# Patient Record
Sex: Male | Born: 1970 | Marital: Single | Smoking: Former smoker
Health system: Southern US, Community
[De-identification: ages and names within clinical notes are randomized; demographics above are authoritative.]

## PROBLEM LIST (undated history)

## (undated) ENCOUNTER — Emergency Department: Discharge status: Absent without leave | Payer: Self-pay

## (undated) DIAGNOSIS — K130 Diseases of lips: Secondary | ICD-10-CM

## (undated) HISTORY — PX: HYDROCELE EXCISION / REPAIR: SUR1145

## (undated) HISTORY — DX: Diseases of lips: K13.0

---

## 2013-03-01 ENCOUNTER — Encounter: Payer: Self-pay | Admitting: Family Medicine

## 2013-03-01 ENCOUNTER — Ambulatory Visit (INDEPENDENT_AMBULATORY_CARE_PROVIDER_SITE_OTHER): Payer: BC Managed Care – PPO | Admitting: Family Medicine

## 2013-03-01 VITALS — BP 113/71 | HR 74 | Temp 99.6°F | Resp 16 | Ht 65.75 in | Wt 125.0 lb

## 2013-03-01 DIAGNOSIS — Z131 Encounter for screening for diabetes mellitus: Secondary | ICD-10-CM

## 2013-03-01 DIAGNOSIS — Z0000 Encounter for general adult medical examination without abnormal findings: Secondary | ICD-10-CM

## 2013-03-01 DIAGNOSIS — K130 Diseases of lips: Secondary | ICD-10-CM

## 2013-03-01 DIAGNOSIS — F17200 Nicotine dependence, unspecified, uncomplicated: Secondary | ICD-10-CM

## 2013-03-01 DIAGNOSIS — E789 Disorder of lipoprotein metabolism, unspecified: Secondary | ICD-10-CM

## 2013-03-01 DIAGNOSIS — M5416 Radiculopathy, lumbar region: Secondary | ICD-10-CM | POA: Insufficient documentation

## 2013-03-01 DIAGNOSIS — M545 Low back pain: Secondary | ICD-10-CM

## 2013-03-01 HISTORY — DX: Diseases of lips: K13.0

## 2013-03-01 MED ORDER — TETANUS-DIPHTH-ACELL PERTUSSIS 5-2.5-18.5 LF-MCG/0.5 IM SUSP
0.5000 mL | Freq: Once | INTRAMUSCULAR | Status: AC
  Administered 2013-03-01: 0.5 mL via INTRAMUSCULAR

## 2013-03-01 NOTE — Progress Notes (Signed)
CC: Damon Maddox is a 42 y.o. male is here for Annual Exam   Subjective: HPI:  Colonoscopy: no family hx no personal hx of rectal bleeding will begin screening at age 72 Prostate: Discussed screening risks/beneifts with patient on 03/01/2013 will begin screening at age 42  Influenza Vaccine: out of season Pneumovax: Patient is a smoker but refuses Td/Tdap: will receive today Zoster: start age 52  Pleasant 42 year old, Patient presents for complete physical exam.    He complains of a swelling on the left side of his face that has come and gone for the last decade. It will become swollen to the size of pea, he will squeeze it discharge will occur it will resolve within a day or 2.  He complains of a blue spot on his lower lip that has been present for 5-10 years it came on abruptly years after a laceration of his lip from a soccer injury. It will bleed once a month when his lips are chapped, he denies scaling skin hypertrophy nor enlargement of the lesion since onset he can confidently say it has not changed appearance in over 5 years.  He reports decades of now and then chronic back pain ever since a snowboarding accident.  Over the past 2 weeks have you been bothered by: - Little interest or pleasure in doing things: no - Feeling down depressed or hopeless:no  He's quite successful with watching what he eats, remote alcohol abuse but no risky drinking recently, one to 2 cigarettes a day no recreational drug use. Tries to stay active with cardio  Review of Systems - General ROS: negative for - chills, fever, night sweats, weight gain or weight loss Ophthalmic ROS: negative for - decreased vision Psychological ROS: negative for - anxiety or depression ENT ROS: negative for - hearing change, nasal congestion, tinnitus or allergies Hematological and Lymphatic ROS: negative for - bleeding problems, bruising or swollen lymph nodes Breast ROS: negative Respiratory ROS: no cough, shortness of  breath, or wheezing Cardiovascular ROS: no chest pain or dyspnea on exertion Gastrointestinal ROS: no abdominal pain, change in bowel habits, or black or bloody stools Genito-Urinary ROS: negative for - genital discharge, genital ulcers, incontinence or abnormal bleeding from genitals Musculoskeletal ROS: negative for - joint pain or muscle pain Neurological ROS: negative for - headaches or memory loss Dermatological ROS: negative for lumps, mole changes, rash and skin lesion changes other than that in HPI  History reviewed. No pertinent past medical history.   Family History  Problem Relation Age of Onset  . Diabetes Mother   . Cancer Father     kidney  . Heart attack Father      History  Substance Use Topics  . Smoking status: Current Some Day Smoker    Types: Cigarettes  . Smokeless tobacco: Not on file  . Alcohol Use: 2.5 oz/week    5 drink(s) per week     Objective: Filed Vitals:   03/01/13 1544  BP: 113/71  Pulse: 74  Temp: 99.6 F (37.6 C)  Resp: 16   General: No Acute Distress HEENT: Atraumatic, normocephalic, conjunctivae normal without scleral icterus.  No nasal discharge, hearing grossly intact, TMs with good landmarks bilaterally with no middle ear abnormalities, posterior pharynx clear without oral lesions. 2 mm linear vascular appearing lesion on the middle right lower  Lip. On left mandible there is a slightly tender 5 mm mobile subcutaneous mass with overlying pore no overlying erythema  Neck: Supple, trachea midline, no cervical nor  supraclavicular adenopathy. Pulmonary: Clear to auscultation bilaterally without wheezing, rhonchi, nor rales. Cardiac: Regular rate and rhythm.  No murmurs, rubs, nor gallops. No peripheral edema.  2+ peripheral pulses bilaterally. Abdomen: Bowel sounds normal.  No masses.  Non-tender without rebound.  Negative Murphy's sign. ZO:[id number] descended non-tender testicles without inguinal hernia MSK: Grossly intact, no signs of  weakness.  Full strength throughout upper and lower extremities.  Full ROM in upper and lower extremities.  No midline spinal tenderness. questionable leg length discrepancy  Neuro: Gait unremarkable, CN II-XII grossly intact.  C5-C6 Reflex 2/4 Bilaterally, L4 Reflex 2/4 Bilaterally.  Cerebellar function intact. Skin: No rashes. Psych: Alert and oriented to person/place/time.  Thought process normal. No anxiety/depression.   Assessment & Plan: Damon Maddox was seen today for annual exam.  Diagnoses and associated orders for this visit:  Annual physical exam - TDaP (BOOSTRIX) injection 0.5 mL; Inject 0.5 mLs into the muscle once.  Diabetes mellitus screening - COMPLETE METABOLIC PANEL WITH GFR  Lipid disorder - Lipid panel  Tobacco use disorder  Low back pain  Lip lesion    Healthy lifestyle interventions including but limited to regular exercise, a healthy low fat diet, moderation of salt intake, the dangers of tobacco/alcohol/recreational drug use, nutrition supplementation, and accident avoidance were discussed with the patient and a handout was provided for future reference. Discussed self testicular exam/ discussed benefits of tobacco cessation. We'll screen for type 2 diabetes, kidney and liver abnormalities along with dyslipidemia. Discussed that the lesion on his lower lip has a small chance of being squamas  cell carcinoma or a precursor however given no evolution in size or presentation in 5 years my suspicion is somewhat lowered. I like him to pay close attention to this over the next months and if any change in character we'll refer to dermatology for biopsy For his chronic back pain there may be a leg length discrepancy he is interested in orthotics as he would like to avoid medications if at all possible, referral to Dr. Karie Schwalbe. sports medicine has been placed  Return in about 4 weeks (around 03/29/2013) for Sports Med Referral.            x

## 2013-03-01 NOTE — Patient Instructions (Signed)
Dr. Roosevelt Eimers's General Advice Following Your Complete Physical Exam  The Benefits of Regular Exercise: Unless you suffer from an uncontrolled cardiovascular condition, studies strongly suggest that regular exercise and physical activity will add to both the quality and length of your life.  The World Health Organization recommends 150 minutes of moderate intensity aerobic activity every week.  This is best split over 3-4 days a week, and can be as simple as a brisk walk for just over 35 minutes "most days of the week".  This type of exercise has been shown to lower LDL-Cholesterol, lower average blood sugars, lower blood pressure, lower cardiovascular disease risk, improve memory, and increase one's overall sense of wellbeing.  The addition of anaerobic (or "strength training") exercises offers additional benefits including but not limited to increased metabolism, prevention of osteoporosis, and improved overall cholesterol levels.  How Can I Strive For A Low-Fat Diet?: Current guidelines recommend that 25-35 percent of your daily energy (food) intake should come from fats.  One might ask how can this be achieved without having to dissect each meal on a daily basis?  Switch to skim or 1% milk instead of whole milk.  Focus on lean meats such as ground turkey, fresh fish, baked chicken, and lean cuts of beef as your source of dietary protein.  Consume less than 300mg/day of dietary cholesterol.  Limit trans fatty acid consumption primarily by limiting synthetic trans fats such as partially hydrogenated oils (Ex: fried fast foods).  Focus efforts on reducing your intake of "solid" fats (Ex: Butter).  Substitute olive or vegetable oil for solid fats where possible.  Moderation of Salt Intake: Provided you don't carry a diagnosis of congestive heart failure nor renal failure, I recommend a daily allowance of no more than 2300 mg of salt (sodium).  Keeping under this daily goal is associated with a  decreased risk of cardiovascular events, creeping above it can lead to elevated blood pressures and increases your risk of cardiovascular events.  Milligrams (mg) of salt is listed on all nutrition labels, and your daily intake can add up faster than you think.  Most canned and frozen dinners can pack in over half your daily salt allowance in one meal.    Lifestyle Health Risks: Certain lifestyle choices carry specific health risks.  As you may already know, tobacco use has been associated with increasing one's risk of cardiovascular disease, pulmonary disease, numerous cancers, among many other issues.  What you may not know is that there are medications and nicotine replacement strategies that can more than double your chances of successfully quitting.  I would be thrilled to help manage your quitting strategy if you currently use tobacco products.  When it comes to alcohol use, I've yet to find an "ideal" daily allowance.  Provided an individual does not have a medical condition that is exacerbated by alcohol consumption, general guidelines determine "safe drinking" as no more than two standard drinks for a man or no more than one standard drink for a male per day.  However, much debate still exists on whether any amount of alcohol consumption is technically "safe".  My general advice, keep alcohol consumption to a minimum for general health promotion.  If you or others believe that alcohol, tobacco, or recreational drug use is interfering with your life, I would be happy to provide confidential counseling regarding treatment options.  General "Over The Counter" Nutrition Advice: Postmenopausal women should aim for a daily calcium intake of 1200 mg, however a significant   portion of this might already be provided by diets including milk, yogurt, cheese, and other dairy products.  Vitamin D has been shown to help preserve bone density, prevent fatigue, and has even been shown to help reduce falls in the  elderly.  Ensuring a daily intake of 800 Units of Vitamin D is a good place to start to enjoy the above benefits, we can easily check your Vitamin D level to see if you'd potentially benefit from supplementation beyond 800 Units a day.  Folic Acid intake should be of particular concern to women of childbearing age.  Daily consumption of 400-800 mcg of Folic Acid is recommended to minimize the chance of spinal cord defects in a fetus should pregnancy occur.    For many adults, accidents still remain one of the most common culprits when it comes to cause of death.  Some of the simplest but most effective preventitive habits you can adopt include regular seatbelt use, proper helmet use, securing firearms, and regularly testing your smoke and carbon monoxide detectors.  Brandey Vandalen B. [facility] DO Med [facility] [address] [place], [place] [address] Phone: [phone]   Testicular Problems and Self-Exam Men can examine themselves easily and effectively with positive results. Monthly exams detect problems early and save lives. There are numerous causes of swelling in the testicle. Testicular cancer usually appears as a firm painless lump in the front part of the testicle. This may feel like a dull ache or heavy feeling located in the lower abdomen (belly), groin, or scrotum.  The risk is greater in men with undescended testicles and it is more common in young men. It is responsible for almost a fifth of cancers in males between ages 100 and 65. Other common causes of swellings, lumps, and testicular pain include injuries, inflammation (soreness) from infection, hydrocele, and torsion. These are a few of the reasons to do monthly self-examination of the testicles. The exam only takes minutes and could add years to your life. Get in the habit! SELF-EXAMINATION OF THE TESTICLES The testicles are easiest to examine after warm baths or showers and are more difficult to examine when you are cold.  This is because the muscles attached to the testicles retract and pull them up higher or into the abdomen. While standing, roll one testicle between the thumb and forefinger. Feel for lumps, swelling, or discomfort. A normal testicle is egg shaped and feels firm. It is smooth and not tender. The spermatic cord can be felt as a firm spaghetti-like cord at the back of the testicle. It is also important to examine your groins. This is the crease between the front of your leg and your abdomen. Also, feel for enlarged lymph nodes (glands). Enlarged nodes are also a cause for you to see your caregiver for evaluation.  Self-examination of the testicles and groin areas on a regular basis will help you to know what your own testicles and groins feel like. This will help you pick up an abnormality (difference) at an earlier stage. Early discovery is the key to curing this cancer or treating other conditions. Any lump, change, or swelling in the testicle calls for immediate evaluation by your caregiver. Cancer of the testicle does not result in impotence and it does not prevent normal intercourse or prevent having children. If your caregiver feels that medical treatment or chemotherapy could lead to infertility, sperm can be frozen for future use. It is necessary to see a caregiver as soon as possible after  the discovery of a lump in a testicle. Document Released: 12/01/2000 Document Revised: 11/17/2011 Document Reviewed: 08/26/2008 [facility] Patient Information 2014 [place], [place].

## 2013-03-02 LAB — COMPLETE METABOLIC PANEL WITH GFR
Albumin: 4.4 g/dL (ref 3.5–5.2)
BUN: 12 mg/dL (ref 6–23)
CO2: 26 mEq/L (ref 19–32)
Calcium: 9.4 mg/dL (ref 8.4–10.5)
Chloride: 104 mEq/L (ref 96–112)
GFR, Est African American: >89 mL/min
GFR, Est Non African American: >89 mL/min
Glucose, Bld: 96 mg/dL (ref 70–99)
Potassium: 4.4 mEq/L (ref 3.5–5.3)

## 2013-03-02 LAB — LIPID PANEL: Cholesterol: 159 mg/dL (ref 0–200)

## 2013-03-08 ENCOUNTER — Ambulatory Visit: Payer: BC Managed Care – PPO | Admitting: Sports Medicine

## 2013-03-09 ENCOUNTER — Ambulatory Visit: Payer: BC Managed Care – PPO | Admitting: Sports Medicine

## 2013-03-18 ENCOUNTER — Ambulatory Visit (INDEPENDENT_AMBULATORY_CARE_PROVIDER_SITE_OTHER): Payer: BC Managed Care – PPO | Admitting: Family Medicine

## 2013-03-18 ENCOUNTER — Encounter: Payer: Self-pay | Admitting: Family Medicine

## 2013-03-18 VITALS — BP 114/78 | HR 73 | Wt 127.0 lb

## 2013-03-18 DIAGNOSIS — L723 Sebaceous cyst: Secondary | ICD-10-CM

## 2013-03-18 NOTE — Progress Notes (Signed)
CC: Damon Maddox is a 42 y.o. male is here for Cyst   Subjective: HPI:  Patient complains of left ear swelling that has been present for 2 weeks it came on abruptly described as mild in severity localized on the back of the left ear just medial to the earlobe. It is slightly tender to the touch it seems to enlarge the more he touches it, it has significantly shrunk over the past 2 days since he has stopped touching it. He denies drainage or skin changes overlying this lesion. He has never noticed this before. He denies hearing loss, ear pain, ear drainage, scalp pain, skin changes, fevers, chills, unintentional weight loss, nor swollen lymph nodes nor bruisability   Review Of Systems Outlined In HPI  Past Medical History  Diagnosis Date  . Lip lesion 03/01/2013     Family History  Problem Relation Age of Onset  . Diabetes Mother   . Cancer Father     kidney  . Heart attack Father      History  Substance Use Topics  . Smoking status: Current Some Day Smoker    Types: Cigarettes  . Smokeless tobacco: Not on file  . Alcohol Use: 2.5 oz/week    5 drink(s) per week     Objective: Filed Vitals:   03/18/13 1547  BP: 114/78  Pulse: 73    General: Alert and Oriented, No Acute Distress HEENT: Pupils equal, round, reactive to light. Conjunctivae clear.  External ears unremarkable, canals clear with intact TMs with appropriate landmarks.  Middle ear appears open without effusion. Pink inferior turbinates.  Moist mucous membranes, pharynx without inflammation nor lesions.  Neck supple without palpable lymphadenopathy nor abnormal masses. Just medial to the left earlobe there is a slightly tender mobile .25 centimeter nodule without overlying skin changes. Scalp is unremarkable  Mental Status: No depression, anxiety, nor agitation. Skin: Warm and dry.  Assessment & Plan: Marico was seen today for cyst.  Diagnoses and associated orders for this visit:  Sebaceous cyst    I suspect  this is a sebaceous cyst that was inflamed and worsened with manipulation, I've encouraged him to avoid further manipulation which should speed up self resolution. Discussed I would like to avoid excision or injection of steroid if at all possible to avoid disfiguring of the ear cartilage  Return if symptoms worsen or fail to improve.

## 2013-03-24 ENCOUNTER — Ambulatory Visit: Payer: BC Managed Care – PPO | Admitting: Sports Medicine

## 2013-05-10 ENCOUNTER — Emergency Department
Admission: EM | Admit: 2013-05-10 | Discharge: 2013-05-10 | Disposition: A | Discharge status: Home / Self Care | Payer: BC Managed Care – PPO | Source: Home / Self Care | Attending: Family Medicine | Admitting: Family Medicine

## 2013-05-10 ENCOUNTER — Encounter: Payer: Self-pay | Admitting: *Deleted

## 2013-05-10 DIAGNOSIS — H01006 Unspecified blepharitis left eye, unspecified eyelid: Secondary | ICD-10-CM

## 2013-05-10 DIAGNOSIS — H01009 Unspecified blepharitis unspecified eye, unspecified eyelid: Secondary | ICD-10-CM

## 2013-05-10 DIAGNOSIS — Z716 Tobacco abuse counseling: Secondary | ICD-10-CM

## 2013-05-10 MED ORDER — AZITHROMYCIN 1 % OP SOLN: 1.0000 [drp] | Freq: Two times a day (BID) | OPHTHALMIC | Status: DC

## 2013-05-10 MED ORDER — POLYMYXIN B-TRIMETHOPRIM 10000-0.1 UNIT/ML-% OP SOLN: 1.0000 [drp] | OPHTHALMIC | Status: DC

## 2013-05-10 NOTE — ED Provider Notes (Signed)
CSN: [id number]     Arrival date & time 05/10/13  1027 History   First MD Initiated Contact with Patient 05/10/13 1039     Chief Complaint  Patient presents with  . Eye Problem    HPI  L lower eyelid irritation x 1 week  Has noticed mild irrititation.  Noticed some swelling earlier this morning.  Mild TTP  Minimal redness No eye pain  No LOV. No headache, fever, chills.  Non diabetic.   Past Medical History  Diagnosis Date  . Lip lesion 03/01/2013   Past Surgical History  Procedure Laterality Date  . Hydrocele excision / repair     Family History  Problem Relation Age of Onset  . Diabetes Mother   . Cancer Father     kidney  . Heart attack Father    History  Substance Use Topics  . Smoking status: Current Some Day Smoker    Types: Cigarettes  . Smokeless tobacco: Not on file  . Alcohol Use: 2.5 oz/week    5 drink(s) per week    Review of Systems  All other systems reviewed and are negative.    Allergies  Review of patient's allergies indicates no known allergies.  Home Medications  No current outpatient prescriptions on file. BP 98/64  Pulse 79  Temp(Src) 98.4 F (36.9 C) (Oral)  Resp 16  Wt 129 lb (58.514 kg)  BMI 20.98 kg/m2  SpO2 97% Physical Exam  Constitutional: He appears well-developed and well-nourished.  HENT:  Head: Normocephalic and atraumatic.  Right Ear: External ear normal.  Left Ear: External ear normal.  Eyes: Conjunctivae and EOM are normal. Pupils are equal, round, and reactive to light.    Mild soft tissue swelling on L lower eyelid Minimal erythema No conjunctival redness  Full ROM  No purulent drainage.    Neck: Normal range of motion. Neck supple.  Cardiovascular: Normal rate, regular rhythm and normal heart sounds.   Pulmonary/Chest: Effort normal and breath sounds normal.  Abdominal: Soft.  Musculoskeletal: Normal range of motion.  Neurological: He is alert.  Skin: Skin is warm.    ED Course  Procedures  (including critical care time) Labs Review Labs Reviewed - No data to display Imaging Review No results found.  MDM   1. Blepharitis of left eye   2. Tobacco abuse counseling    Begin warm compress until area begins draining.  Start op azithro once area begins draining.  Discussed infectious and ophtho red flags that would warrant reevaluation or oral abx.  Handout given.  Discussed smoking cessation.  Follow up as needed.     The patient and/or caregiver has been counseled thoroughly with regard to treatment plan and/or medications prescribed including dosage, schedule, interactions, rationale for use, and possible side effects and they verbalize understanding. Diagnoses and expected course of recovery discussed and will return if not improved as expected or if the condition worsens. Patient and/or caregiver verbalized understanding.         Doree Albee, MD 05/10/13 1054

## 2013-05-10 NOTE — ED Notes (Signed)
Pt c/o stye on LT eye x 1wk, worse x today. Denies fever.

## 2013-05-17 ENCOUNTER — Encounter: Payer: Self-pay | Admitting: Family Medicine

## 2013-05-17 ENCOUNTER — Ambulatory Visit (INDEPENDENT_AMBULATORY_CARE_PROVIDER_SITE_OTHER): Payer: BC Managed Care – PPO | Admitting: Family Medicine

## 2013-05-17 VITALS — BP 118/73 | HR 74 | Wt 128.0 lb

## 2013-05-17 DIAGNOSIS — H00026 Hordeolum internum left eye, unspecified eyelid: Secondary | ICD-10-CM | POA: Insufficient documentation

## 2013-05-17 DIAGNOSIS — H00029 Hordeolum internum unspecified eye, unspecified eyelid: Secondary | ICD-10-CM

## 2013-05-17 NOTE — Progress Notes (Signed)
CC: Damon Maddox is a 42 y.o. male is here for stye on left eye   Subjective: HPI:  Left lower eyelid swelling that has been present for one week worsening on daily basis no improvement with Polytrim every 4 hours and hot compresses  3-4 times a day since late last week. Associated with moderate pain on the lower eyelid at the site of swelling radiating to the forehead. He's had a little bit of vision loss in his lower hemisphere on the left however this is completely resolved if he manually lowers his lower lid with his hands. Other than tearing he denies discharge from the eye he denies pain with movement of the eye he denies vision loss of the above nor reddening of the eyes. Denies photophobia. Denies fevers, chills, nasal congestion, sore throat, cough. States overall he feels pretty good other than the pain in his eyelid. Does not hurt to blink  Review Of Systems Outlined In HPI  Past Medical History  Diagnosis Date  . Lip lesion 03/01/2013     Family History  Problem Relation Age of Onset  . Diabetes Mother   . Cancer Father     kidney  . Heart attack Father      History  Substance Use Topics  . Smoking status: Current Some Day Smoker    Types: Cigarettes  . Smokeless tobacco: Not on file  . Alcohol Use: 2.5 oz/week    5 drink(s) per week     Objective: Filed Vitals:   05/17/13 1450  BP: 118/73  Pulse: 74    General: Alert and Oriented, No Acute Distress HEENT: Pupils equal, round, reactive to light. Conjunctivae clear.  Middle of the lower left eyelid has approximately one half centimeter diameter mobile mass without gross changes or abnormalities on the lower lid hair follicles. Overlying skin is just barely erythematous, lesion is overall nontender to palpation. External ears unremarkable, canals clear with intact TMs with appropriate landmarks.  Middle ear appears open without effusion. Pink inferior turbinates.  Moist mucous membranes, pharynx without inflammation  nor lesions.  Neck supple without palpable lymphadenopathy nor abnormal masses. Cardiac: Regular rate and rhythm. Normal S1/S2.  No murmurs, rubs, nor gallops.   Extremities: No peripheral edema.  Strong peripheral pulses.  Mental Status: No depression, anxiety, nor agitation. Skin: Warm and dry.  Assessment & Plan: Beth was seen today for stye on left eye.  Diagnoses and associated orders for this visit:  Internal hordeolum of left eye - Ambulatory referral to Ophthalmology    Encourage patient to start Bactrim he has reservations about taking antibiotics and would prefer to have the guidance of a urgent ophthalmology referral that we have arranged for tomorrow a.m. with [facility] Suspect that this may need incision and drainage to continue on Polytrim.Signs and symptoms requring emergent/urgent reevaluation were discussed with the patient.  Return if symptoms worsen or fail to improve.

## 2013-07-04 ENCOUNTER — Institutional Professional Consult (permissible substitution): Payer: BC Managed Care – PPO | Admitting: Sports Medicine

## 2013-07-05 ENCOUNTER — Encounter: Payer: Self-pay | Admitting: Sports Medicine

## 2013-07-05 ENCOUNTER — Ambulatory Visit (INDEPENDENT_AMBULATORY_CARE_PROVIDER_SITE_OTHER): Payer: BC Managed Care – PPO | Admitting: Sports Medicine

## 2013-07-05 ENCOUNTER — Ambulatory Visit (INDEPENDENT_AMBULATORY_CARE_PROVIDER_SITE_OTHER): Payer: BC Managed Care – PPO

## 2013-07-05 VITALS — BP 120/82 | HR 73 | Wt 127.0 lb

## 2013-07-05 DIAGNOSIS — IMO0002 Reserved for concepts with insufficient information to code with codable children: Secondary | ICD-10-CM

## 2013-07-05 DIAGNOSIS — M5416 Radiculopathy, lumbar region: Secondary | ICD-10-CM

## 2013-07-05 DIAGNOSIS — R9389 Abnormal findings on diagnostic imaging of other specified body structures: Secondary | ICD-10-CM

## 2013-07-05 DIAGNOSIS — M5137 Other intervertebral disc degeneration, lumbosacral region: Secondary | ICD-10-CM

## 2013-07-05 MED ORDER — MELOXICAM 15 MG PO TABS: ORAL_TABLET | ORAL | Status: DC

## 2013-07-05 MED ORDER — PREDNISONE 50 MG PO TABS: ORAL_TABLET | ORAL | Status: DC

## 2013-07-05 NOTE — Assessment & Plan Note (Signed)
Prednisone, Mobic, x-rays, formal physical therapy. Return in 4 weeks, MRI for interventional injection planning if no better.

## 2013-07-05 NOTE — Progress Notes (Signed)
   Subjective:    I'm seeing this patient as a consultation for:  Dr. Laren Boom  CC: Back pain  HPI: This is a pleasant 42 year old here for evaluation of his low back pain. About 20 years ago he fell about 8 feet and landed on his back. He had persistent lower back pain for months following the injury, and it has flared up intermittently since then. He was doing well until about 3 weeks ago when he tweaked his back at the gym. He has moderate, persistent pain in the lower lumbar spine that worsens with forward flexion and when lying on his back. He also has pain climbing stairs. He has been managing the pain with ibuprofen, which helps somewhat. He denies any loss of bowel or bladder function. He has some tingling radiating into the buttocks and lateral thigh, and his right great toe has occasionally gone numb. He is most concerned about the back pain today. He is also interested in getting custom orthotics for his shoes.   Past medical history, Surgical history, Family history not pertinant except as noted below, Social history, Allergies, and medications have been entered into the medical record, reviewed, and no changes needed.   Review of Systems: No headache, visual changes, nausea, vomiting, diarrhea, constipation, dizziness, abdominal pain, skin rash, fevers, chills, night sweats, weight loss, swollen lymph nodes, body aches, joint swelling, muscle aches, chest pain, shortness of breath, mood changes, visual or auditory hallucinations.   Objective:   General: Well Developed, well nourished, and in no acute distress.  Neuro/Psych: Alert and oriented x3, extra-ocular muscles intact, able to move all 4 extremities, sensation grossly intact. Skin: Warm and dry, no rashes noted.  Respiratory: Not using accessory muscles, speaking in full sentences, trachea midline.  Cardiovascular: Pulses palpable, no extremity edema. Abdomen: Does not appear distended. Back Exam:  Inspection: Unremarkable    Motion: Flexion 45 deg, Extension 45 deg, Side Bending to 45 deg bilaterally,  Rotation to 45 deg bilaterally  SLR laying: Positive on Left side XSLR laying: Negative  Palpable tenderness: Tender to palpation on lower lumbar spine FABER: Positive on Left Sensory change: Gross sensation intact to all lumbar and sacral dermatomes.  Reflexes: 2+ at both patellar tendons, 2+ at achilles tendons, Babinski's downgoing.  Strength at foot  Plantar-flexion: 5/5 Dorsi-flexion: 5/5 Eversion: 5/5 Inversion: 5/5  Leg strength  Quad: 5/5 Hamstring: 5/5 Hip flexor: 4/5 bilaterally Hip abductors: 5/5  Gait unremarkable.  Impression and Recommendations:   This case required medical decision making of moderate complexity.  Assessment: This is a 42 year old male with likely L4 vs L5 radiculopathy.  Plan: 1. X-ray lumbar spine today 2. Prednisone 3. Meloxicam 4. Refer to physical therapy 5. Return for follow-up in 4 weeks to evaluate response and need for MRI 6. Return to be fitted for custom orthotics  This note was originally written by Karin Lieu MS3.

## 2013-07-08 ENCOUNTER — Encounter: Payer: Self-pay | Admitting: Sports Medicine

## 2013-07-08 ENCOUNTER — Ambulatory Visit (INDEPENDENT_AMBULATORY_CARE_PROVIDER_SITE_OTHER): Payer: BC Managed Care – PPO | Admitting: Sports Medicine

## 2013-07-08 VITALS — BP 108/75 | HR 72 | Wt 127.0 lb

## 2013-07-08 DIAGNOSIS — M79671 Pain in right foot: Secondary | ICD-10-CM | POA: Insufficient documentation

## 2013-07-08 DIAGNOSIS — M79609 Pain in unspecified limb: Secondary | ICD-10-CM

## 2013-07-08 DIAGNOSIS — IMO0002 Reserved for concepts with insufficient information to code with codable children: Secondary | ICD-10-CM

## 2013-07-08 DIAGNOSIS — M5416 Radiculopathy, lumbar region: Secondary | ICD-10-CM

## 2013-07-08 NOTE — Progress Notes (Signed)
    Patient was fitted for a : standard, cushioned, semi-rigid orthotic. The orthotic was heated and afterward the patient stood on the orthotic blank positioned on the orthotic stand. The patient was positioned in subtalar neutral position and 10 degrees of ankle dorsiflexion in a weight bearing stance. After completion of molding, a stable base was applied to the orthotic blank. The blank was ground to a stable position for weight bearing. Size:10 Base: Blue EVA Additional Posting and Padding: None The patient ambulated these, and they were very comfortable.  I spent 40 minutes with this patient, greater than 50% was face-to-face time counseling regarding the below diagnosis.   

## 2013-07-08 NOTE — Assessment & Plan Note (Addendum)
Feeling a lot better with one day of prednisone. Continue home exercises, finish medications. Return to see me in 4 weeks as previously planned.

## 2013-07-08 NOTE — Assessment & Plan Note (Signed)
Custom orthotics as above. 

## 2013-07-22 ENCOUNTER — Ambulatory Visit: Payer: BC Managed Care – PPO | Admitting: Physical Therapy

## 2013-07-22 DIAGNOSIS — M545 Low back pain: Secondary | ICD-10-CM

## 2013-07-29 DIAGNOSIS — M545 Low back pain: Secondary | ICD-10-CM

## 2013-08-01 ENCOUNTER — Encounter (INDEPENDENT_AMBULATORY_CARE_PROVIDER_SITE_OTHER): Payer: BC Managed Care – PPO | Admitting: Physical Therapy

## 2013-08-01 DIAGNOSIS — M545 Low back pain: Secondary | ICD-10-CM

## 2014-08-09 ENCOUNTER — Encounter: Payer: Self-pay | Admitting: Family Medicine

## 2014-08-09 ENCOUNTER — Telehealth: Payer: Self-pay

## 2014-08-09 ENCOUNTER — Ambulatory Visit (INDEPENDENT_AMBULATORY_CARE_PROVIDER_SITE_OTHER): Payer: BC Managed Care – PPO | Admitting: Family Medicine

## 2014-08-09 VITALS — BP 130/88 | HR 75 | Wt 121.0 lb

## 2014-08-09 DIAGNOSIS — M5412 Radiculopathy, cervical region: Secondary | ICD-10-CM

## 2014-08-09 DIAGNOSIS — R6882 Decreased libido: Secondary | ICD-10-CM | POA: Diagnosis not present

## 2014-08-09 MED ORDER — PREDNISONE 50 MG PO TABS
ORAL_TABLET | ORAL | Status: DC
Start: 2014-08-09 — End: 2016-12-17

## 2014-08-09 MED ORDER — METHYLPREDNISOLONE SODIUM SUCC 125 MG IJ SOLR
125.0000 mg | Freq: Once | INTRAMUSCULAR | Status: AC
Start: 2014-08-09 — End: 2014-08-09
  Administered 2014-08-09: 125 mg via INTRAMUSCULAR

## 2014-08-09 NOTE — Telephone Encounter (Signed)
My error. The message should have been sent to Dr Ileene Rubens.

## 2014-08-09 NOTE — Addendum Note (Signed)
Addended by: Terance Hart on: 08/09/2014 12:33 PM   Modules accepted: Orders

## 2014-08-09 NOTE — Telephone Encounter (Signed)
I have not seen this patient for over one year, please have him come back to see me regarding what hurts?

## 2014-08-09 NOTE — Telephone Encounter (Signed)
Damon Maddox reports he is still in pain. He would like a pain medication. He states a small prescription of hydrocodone would be great.

## 2014-08-09 NOTE — Progress Notes (Signed)
CC: Damon Maddox is a 43 y.o. male is here for right shoulder pain   Subjective: HPI:  1 week of right arm pain that begins on the lateral right neck and radiates down the back of the shoulder through the back of the arm into the thumb. It has been persistent since onset and was not preceded by any overexertion or trauma. Symptoms are worse with flexion or extension of the neck. Pain is described as a deep weakness. He's noticed mild weakness throughout the entire upper extremity but no numbness or sensory disturbance. Symptoms are worse when lying down and particularly first thing in the morning. No benefit from over-the-counter anti-inflammatories. He's never had this before. Denies chest pain shortness of breath nor has there been any difficulty swallowing.  Expresses concern with decreasing libido over the past year. He describes that his only had sex once with his wife over the last year and this is abnormal. He still is attracted to her and enjoys his marriage but just has no sex drive towards her anything else. He reports some dissatisfaction at work but no other symptoms or suspicion for depression.   Review Of Systems Outlined In HPI  Past Medical History  Diagnosis Date  . Lip lesion 03/01/2013    Past Surgical History  Procedure Laterality Date  . Hydrocele excision / repair     Family History  Problem Relation Age of Onset  . Diabetes Mother   . Cancer Father     kidney  . Heart attack Father     History   Social History  . Marital Status: Single    Spouse Name: N/A    Number of Children: N/A  . Years of Education: N/A   Occupational History  . Not on file.   Social History Main Topics  . Smoking status: Current Some Day Smoker    Types: Cigarettes  . Smokeless tobacco: Not on file  . Alcohol Use: 2.5 oz/week    5 drink(s) per week  . Drug Use: No  . Sexual Activity:    Partners: Female     Comment: male   Other Topics Concern  . Not on file   Social  History Narrative     Objective: BP 130/88 mmHg  Pulse 75  Wt 121 lb (54.885 kg)  General: Alert and Oriented, No Acute Distress HEENT: Pupils equal, round, reactive to light. Conjunctivae clear.  Moist pedis membranes times unremarkable Lungs: Clear to auscultation bilaterally, no wheezing/ronchi/rales.  Comfortable work of breathing. Good air movement. Right shoulder exam reveals full range of motion and strength in all planes of motion and with individual rotator cuff testing. No overlying redness warmth or swelling.  Neer's test negative.  Hawkins test negative. Empty can negative. Crossarm test negative. O'Brien's test negative. Apprehension test negative. Speed's test negative. Back: Mild bilateral upper trapezius hypertonicity. Pain is reproduced with extension of the neck and he is unwilling to do Spurling's due to the degree of the pain with extension of the neck. Extremities: Full range of motion and strength of the right upper extremity Mental Status: No depression, anxiety, nor agitation. Skin: Warm and dry.  Assessment & Plan: Keatin was seen today for right shoulder pain.  Diagnoses and associated orders for this visit:  Low libido - Testosterone  Cervical radiculitis - predniSONE (DELTASONE) 50 MG tablet; One tab PO daily for 5 days.    Low libido: Checking testosterone, low suspicion for depression causing this Cervical radiculitis: Start prednisone for 5  days, he'll receive Solu-Medrol 125 mg here in the office today. If no better by next week return for referral to Dr. Darene Lamer for second opinion.   Return if symptoms worsen or fail to improve.

## 2014-08-10 MED ORDER — TRAMADOL HCL 50 MG PO TABS: 50.0000 mg | ORAL_TABLET | Freq: Three times a day (TID) | ORAL | Status: DC | PRN

## 2014-08-10 NOTE — Telephone Encounter (Signed)
Left message stating Tramadol has been faxed.

## 2014-08-10 NOTE — Telephone Encounter (Signed)
I would recommend trying tramadol first.  Rx in Andrea's inbox.  Let me know if no better by closing time Friday.

## 2014-11-28 LAB — TESTOSTERONE: TESTOSTERONE: 587 ng/dL (ref 300–890)

## 2016-12-17 ENCOUNTER — Ambulatory Visit (INDEPENDENT_AMBULATORY_CARE_PROVIDER_SITE_OTHER): Payer: BLUE CROSS/BLUE SHIELD | Admitting: Physician Assistant

## 2016-12-17 ENCOUNTER — Encounter: Payer: Self-pay | Admitting: Physician Assistant

## 2016-12-17 VITALS — BP 106/72 | HR 69 | Ht 65.75 in | Wt 124.0 lb

## 2016-12-17 DIAGNOSIS — Z0000 Encounter for general adult medical examination without abnormal findings: Secondary | ICD-10-CM

## 2016-12-17 DIAGNOSIS — R6882 Decreased libido: Secondary | ICD-10-CM

## 2016-12-17 DIAGNOSIS — Z13220 Encounter for screening for lipoid disorders: Secondary | ICD-10-CM | POA: Diagnosis not present

## 2016-12-17 DIAGNOSIS — Z8051 Family history of malignant neoplasm of kidney: Secondary | ICD-10-CM | POA: Insufficient documentation

## 2016-12-17 DIAGNOSIS — R5383 Other fatigue: Secondary | ICD-10-CM

## 2016-12-17 DIAGNOSIS — Z131 Encounter for screening for diabetes mellitus: Secondary | ICD-10-CM

## 2016-12-17 LAB — POCT URINALYSIS DIPSTICK
Bilirubin, UA: NEGATIVE
Blood, UA: NEGATIVE
Glucose, UA: NEGATIVE
Ketones, UA: NEGATIVE
LEUKOCYTES UA: NEGATIVE — AB
Nitrite, UA: NEGATIVE
PH UA: 7 (ref 5.0–8.0)
PROTEIN UA: NEGATIVE
Spec Grav, UA: 1.02 (ref 1.010–1.025)
UROBILINOGEN UA: 1 U/dL

## 2016-12-17 NOTE — Progress Notes (Signed)
Subjective:    Patient ID: Damon Maddox, male    DOB: 29-Feb-1971, 46 y.o.   MRN: [id number]  HPI  Pt is a 47 yo male who presents to the clinic for CPE.  He does mention his dad dying of bladder cancer and wanted to make sure we checked his urine today.  He is also just tired and doesn't have any libido. Testosterone was checked and normal a few years back. Once he starts sex he has no problem getting and keeping an erection he just never wants to do it.   .. Active Ambulatory Problems    Diagnosis Date Noted  . Lip lesion 03/01/2013  . Right lumbar radiculitis 03/01/2013  . Tobacco use disorder 03/01/2013  . Internal hordeolum of left eye 05/17/2013  . Bilateral foot pain 07/08/2013  . Low libido 08/09/2014  . Family history of kidney cancer 12/17/2016   Resolved Ambulatory Problems    Diagnosis Date Noted  . No Resolved Ambulatory Problems   Past Medical History:  Diagnosis Date  . Lip lesion 03/01/2013   .Marland Kitchen Family History  Problem Relation Age of Onset  . Diabetes Mother   . Cancer Father     kidney  . Heart attack Father    .Marland Kitchen Social History   Social History  . Marital status: Single    Spouse name: N/A  . Number of children: N/A  . Years of education: N/A   Occupational History  . Not on file.   Social History Main Topics  . Smoking status: Former Smoker    Types: Cigarettes  . Smokeless tobacco: Never Used  . Alcohol use 2.5 oz/week    5 Standard drinks or equivalent per week  . Drug use: No  . Sexual activity: Yes    Partners: Female     Comment: male   Other Topics Concern  . Not on file   Social History Narrative  . No narrative on file         Review of Systems  All other systems reviewed and are negative.      Objective:   Physical Exam BP 106/72   Pulse 69   Ht 5' 5.75" (1.67 m)   Wt 124 lb (56.2 kg)   BMI 20.17 kg/m   General Appearance:    Alert, cooperative, no distress, appears stated age  Head:    Normocephalic,  without obvious abnormality, atraumatic  Eyes:    PERRL, conjunctiva/corneas clear, EOM's intact, fundi    benign, both eyes       Ears:    Normal TM's and external ear canals, both ears  Nose:   Nares normal, septum midline, mucosa normal, no drainage    or sinus tenderness  Throat:   Lips, mucosa, and tongue normal; teeth and gums normal  Neck:   Supple, symmetrical, trachea midline, no adenopathy;       thyroid:  No enlargement/tenderness/nodules; no carotid   bruit or JVD  Back:     Symmetric, no curvature, ROM normal, no CVA tenderness  Lungs:     Clear to auscultation bilaterally, respirations unlabored  Chest wall:    No tenderness or deformity  Heart:    Regular rate and rhythm, S1 and S2 normal, no murmur, rub   or gallop  Abdomen:     Soft, non-tender, bowel sounds active all four quadrants,    no masses, no organomegaly        Extremities:   Extremities normal, atraumatic, no cyanosis  or edema  Pulses:   2+ and symmetric all extremities  Skin:   Skin color, texture, turgor normal, no rashes or lesions  Lymph nodes:   Cervical, supraclavicular, and axillary nodes normal  Neurologic:   CNII-XII intact. Normal strength, sensation and reflexes      throughout         Assessment & Plan:  Marland KitchenMarland KitchenAllin was seen today for annual exam.  Diagnoses and all orders for this visit:  Routine physical examination -     Lipid panel -     COMPLETE METABOLIC PANEL WITH GFR -     Testosterone -     CBC -     C-reactive protein -     Ferritin -     Sedimentation rate -     TSH -     Vitamin B12 -     Vit D  25 hydroxy (rtn osteoporosis monitoring)  Screening for lipid disorders -     Lipid panel  Screening for diabetes mellitus -     COMPLETE METABOLIC PANEL WITH GFR  Low libido -     Testosterone  No energy -     CBC -     C-reactive protein -     Ferritin -     Sedimentation rate -     TSH -     Vitamin B12 -     Vit D  25 hydroxy (rtn osteoporosis  monitoring)  Family history of kidney cancer -     POCT urinalysis dipstick -     Urinalysis, microscopic only   .Marland Kitchen Results for orders placed or performed in visit on 12/17/16  Urinalysis, microscopic only  Result Value Ref Range   WBC, UA NONE SEEN <=5 WBC/HPF   RBC / HPF NONE SEEN <=2 RBC/HPF   Squamous Epithelial / LPF NONE SEEN <=5 HPF   Bacteria, UA NONE SEEN NONE SEEN HPF   Crystals NONE SEEN NONE SEEN HPF   Casts NONE SEEN NONE SEEN LPF   Yeast NONE SEEN NONE SEEN HPF  POCT urinalysis dipstick  Result Value Ref Range   Color, UA yellow    Clarity, UA clear    Glucose, UA neg    Bilirubin, UA neg    Ketones, UA neg    Spec Grav, UA 1.020 1.010 - 1.025   Blood, UA neg    pH, UA 7.0 5.0 - 8.0   Protein, UA neg    Urobilinogen, UA 1.0 0.2 or 1.0 E.U./dL   Nitrite, UA neg    Leukocytes, UA Negative (A) small (1+)   Urine looks good.  Ordered fasting labs.  Will do a fatigue panel. Pt denies any snoring and not overweight at all do not think sleep apena is an issue. Depression screening negative.  Will recheck testosterone.   .. Depression screen [facility] 2/9 12/17/2016  Decreased Interest 0  Down, Depressed, Hopeless 0  PHQ - 2 Score 0

## 2016-12-17 NOTE — Patient Instructions (Signed)

## 2016-12-18 LAB — URINALYSIS, MICROSCOPIC ONLY
Bacteria, UA: NONE SEEN [HPF]
Casts: NONE SEEN [LPF]
Crystals: NONE SEEN [HPF]
RBC / HPF: NONE SEEN RBC/HPF (ref <=2)
SQUAMOUS EPITHELIAL / LPF: NONE SEEN [HPF] (ref <=5)
WBC UA: NONE SEEN WBC/HPF (ref <=5)
Yeast: NONE SEEN [HPF]

## 2017-02-19 DIAGNOSIS — R5383 Other fatigue: Secondary | ICD-10-CM | POA: Diagnosis not present

## 2017-02-19 DIAGNOSIS — Z0000 Encounter for general adult medical examination without abnormal findings: Secondary | ICD-10-CM | POA: Diagnosis not present

## 2017-02-19 DIAGNOSIS — R6882 Decreased libido: Secondary | ICD-10-CM | POA: Diagnosis not present

## 2017-02-19 DIAGNOSIS — Z131 Encounter for screening for diabetes mellitus: Secondary | ICD-10-CM | POA: Diagnosis not present

## 2017-02-19 LAB — CBC
HCT: 46.3 % (ref 38.5–50.0)
Hemoglobin: 15.3 g/dL (ref 13.2–17.1)
MCH: 30.1 pg (ref 27.0–33.0)
MCHC: 33 g/dL (ref 32.0–36.0)
MCV: 91.1 fL (ref 80.0–100.0)
MPV: 10 fL (ref 7.5–12.5)
PLATELETS: 200 10*3/uL (ref 140–400)
RBC: 5.08 MIL/uL (ref 4.20–5.80)
RDW: 13.7 % (ref 11.0–15.0)
WBC: 5.7 10*3/uL (ref 3.8–10.8)

## 2017-02-20 ENCOUNTER — Encounter: Payer: Self-pay | Admitting: Physician Assistant

## 2017-02-20 DIAGNOSIS — E7800 Pure hypercholesterolemia, unspecified: Secondary | ICD-10-CM | POA: Insufficient documentation

## 2017-02-20 LAB — COMPLETE METABOLIC PANEL WITH GFR
ALT: 25 U/L (ref 9–46)
AST: 21 U/L (ref 10–40)
Albumin: 4.5 g/dL (ref 3.6–5.1)
Alkaline Phosphatase: 57 U/L (ref 40–115)
BILIRUBIN TOTAL: 0.7 mg/dL (ref 0.2–1.2)
BUN: 21 mg/dL (ref 7–25)
CO2: 24 mmol/L (ref 20–31)
Calcium: 9.4 mg/dL (ref 8.6–10.3)
Chloride: 104 mmol/L (ref 98–110)
Creat: 0.92 mg/dL (ref 0.60–1.35)
GLUCOSE: 92 mg/dL (ref 65–99)
POTASSIUM: 4.6 mmol/L (ref 3.5–5.3)
SODIUM: 139 mmol/L (ref 135–146)
TOTAL PROTEIN: 6.7 g/dL (ref 6.1–8.1)

## 2017-02-20 LAB — LIPID PANEL
CHOL/HDL RATIO: 3.5 ratio (ref <5.0)
Cholesterol: 216 mg/dL — ABNORMAL HIGH (ref <200)
HDL: 62 mg/dL (ref >40)
LDL Cholesterol: 134 mg/dL — ABNORMAL HIGH (ref <100)
TRIGLYCERIDES: 100 mg/dL (ref <150)
VLDL: 20 mg/dL (ref <30)

## 2017-02-20 LAB — TESTOSTERONE: Testosterone: 533 ng/dL (ref 250–827)

## 2017-02-20 LAB — TSH: TSH: 1.7 mIU/L (ref 0.40–4.50)

## 2017-02-20 LAB — C-REACTIVE PROTEIN: CRP: <0.2 mg/L (ref <8.0)

## 2017-02-20 LAB — SEDIMENTATION RATE: SED RATE: 1 mm/h (ref 0–15)

## 2017-02-20 LAB — VITAMIN B12: VITAMIN B 12: 485 pg/mL (ref 200–1100)

## 2017-02-20 LAB — FERRITIN: FERRITIN: 164 ng/mL (ref 20–380)

## 2017-02-20 LAB — VITAMIN D 25 HYDROXY (VIT D DEFICIENCY, FRACTURES): VIT D 25 HYDROXY: 40 ng/mL (ref 30–100)

## 2017-02-20 NOTE — Progress Notes (Signed)
Call pt: labs look great.  LDL is a little bit elevated. TG good. HDL good. Need to work on low fat diet and exercise. Goal is to get LDL under 100.

## 2017-10-26 ENCOUNTER — Encounter: Payer: BLUE CROSS/BLUE SHIELD | Admitting: Physician Assistant

## 2017-10-26 ENCOUNTER — Ambulatory Visit: Payer: BLUE CROSS/BLUE SHIELD

## 2017-10-26 ENCOUNTER — Ambulatory Visit (INDEPENDENT_AMBULATORY_CARE_PROVIDER_SITE_OTHER): Payer: BLUE CROSS/BLUE SHIELD | Admitting: Physician Assistant

## 2017-10-26 ENCOUNTER — Encounter: Payer: Self-pay | Admitting: Sports Medicine

## 2017-10-26 ENCOUNTER — Ambulatory Visit (INDEPENDENT_AMBULATORY_CARE_PROVIDER_SITE_OTHER): Payer: BLUE CROSS/BLUE SHIELD | Admitting: Sports Medicine

## 2017-10-26 ENCOUNTER — Ambulatory Visit: Payer: BLUE CROSS/BLUE SHIELD | Admitting: Sports Medicine

## 2017-10-26 ENCOUNTER — Encounter: Payer: Self-pay | Admitting: Physician Assistant

## 2017-10-26 VITALS — BP 112/67 | HR 74 | Ht 65.75 in | Wt 121.0 lb

## 2017-10-26 DIAGNOSIS — M67471 Ganglion, right ankle and foot: Secondary | ICD-10-CM | POA: Insufficient documentation

## 2017-10-26 DIAGNOSIS — E7800 Pure hypercholesterolemia, unspecified: Secondary | ICD-10-CM

## 2017-10-26 DIAGNOSIS — Z0001 Encounter for general adult medical examination with abnormal findings: Secondary | ICD-10-CM

## 2017-10-26 DIAGNOSIS — Z131 Encounter for screening for diabetes mellitus: Secondary | ICD-10-CM | POA: Diagnosis not present

## 2017-10-26 DIAGNOSIS — D229 Melanocytic nevi, unspecified: Secondary | ICD-10-CM

## 2017-10-26 DIAGNOSIS — Z0000 Encounter for general adult medical examination without abnormal findings: Secondary | ICD-10-CM

## 2017-10-26 DIAGNOSIS — Z8052 Family history of malignant neoplasm of bladder: Secondary | ICD-10-CM | POA: Diagnosis not present

## 2017-10-26 DIAGNOSIS — D225 Melanocytic nevi of trunk: Secondary | ICD-10-CM | POA: Diagnosis not present

## 2017-10-26 LAB — POCT URINALYSIS DIPSTICK
BILIRUBIN UA: NEGATIVE
GLUCOSE UA: NEGATIVE
Ketones, UA: NEGATIVE
Nitrite, UA: NEGATIVE
Protein, UA: NEGATIVE
RBC UA: NEGATIVE
Spec Grav, UA: 1.01 (ref 1.010–1.025)
Urobilinogen, UA: 0.2 E.U./dL
pH, UA: 7 (ref 5.0–8.0)

## 2017-10-26 NOTE — Assessment & Plan Note (Signed)
That has since resolved, from the medial first MTP. X-rays, I do suspect some arthritis. He will return for new set of custom orthotics. No further intervention needed.

## 2017-10-26 NOTE — Progress Notes (Signed)
a 

## 2017-10-26 NOTE — Patient Instructions (Signed)

## 2017-10-26 NOTE — Progress Notes (Signed)
Subjective:    Patient ID: Damon Maddox, male    DOB: 09-17-1970, 47 y.o.   MRN: [id number]  HPI Patient patient is a 47 year old male who presents to the clinic for routine physical.  He would like an overall skin check.  He has a few dark moles he would like looked at.  His father has bladder cancer is concerned about his old cancerous.  He would like to see if there is any screenings for this.  .. Active Ambulatory Problems    Diagnosis Date Noted  . Lip lesion 03/01/2013  . Right lumbar radiculitis 03/01/2013  . Tobacco use disorder 03/01/2013  . Internal hordeolum of left eye 05/17/2013  . Bilateral foot pain 07/08/2013  . Low libido 08/09/2014  . Family history of kidney cancer 12/17/2016  . Elevated LDL cholesterol level 02/20/2017  . Ganglion cyst of right foot 10/26/2017  . 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 No Resolved Ambulatory Problems   Past Medical History:  Diagnosis Date  . Lip lesion 03/01/2013   .Marland Kitchen Family History  Problem Relation Age of Onset  . Diabetes Mother   . Cancer Father        kidney  . Heart attack Father    .Marland Kitchen Social History   Socioeconomic History  . Marital status: Single    Spouse name: Not on file  . Number of children: Not on file  . Years of education: Not on file  . Highest education level: Not on file  Social Needs  . Financial resource strain: Not on file  . Food insecurity - worry: Not on file  . Food insecurity - inability: Not on file  . Transportation needs - medical: Not on file  . Transportation needs - non-medical: Not on file  Occupational History  . Not on file  Tobacco Use  . Smoking status: Former Smoker    Types: Cigarettes  . Smokeless tobacco: Never Used  Substance and Sexual Activity  . Alcohol use: Yes    Alcohol/week: 2.5 oz    Types: 5 Standard drinks or equivalent per week  . Drug use: No  . Sexual activity: Yes    Partners: Female   Comment: male  Other Topics Concern  . Not on file  Social History Narrative  . Not on file     Review of Systems Other than HPI negative.     Objective:   Physical Exam BP 112/67   Pulse 74   Ht 5' 5.75" (1.67 m)   Wt 121 lb (54.9 kg)   BMI 19.68 kg/m   General Appearance:    Alert, cooperative, no distress, appears stated age  Head:    Normocephalic, without obvious abnormality, atraumatic  Eyes:    PERRL, conjunctiva/corneas clear, EOM's intact, fundi    benign, both eyes       Ears:    Normal TM's and external ear canals, both ears  Nose:   Nares normal, septum midline, mucosa normal, no drainage    or sinus tenderness  Throat:   Lips, mucosa, and tongue normal; teeth and gums normal  Neck:   Supple, symmetrical, trachea midline, no adenopathy;       thyroid:  No enlargement/tenderness/nodules; no carotid   bruit or JVD  Back:     Symmetric, no curvature, ROM normal, no CVA tenderness  Lungs:     Clear to auscultation bilaterally, respirations unlabored  Chest wall:  No tenderness or deformity  Heart:    Regular rate and rhythm, S1 and S2 normal, no murmur, rub   or gallop  Abdomen:     Soft, non-tender, bowel sounds active all four quadrants,    no masses, no organomegaly        Extremities:   Extremities normal, atraumatic, no cyanosis or edema  Pulses:   2+ and symmetric all extremities  Skin:   Skin color, texture, turgor normal, no rashes or lesions one upper left back 69mm hyperpigmented atypical nevus, 4 mm hyperpigmented mid low back atypical nevus, lentigo of right temple in hair line.   Lymph nodes:   Cervical, supraclavicular, and axillary nodes normal  Neurologic:   CNII-XII intact. Normal strength, sensation and reflexes      throughout         Assessment & Plan:  Marland KitchenMarland KitchenMacarthur was seen today for annual exam.  Diagnoses and all orders for this visit:  Routine physical examination -     Lipid Panel w/reflex Direct LDL -     COMPLETE METABOLIC PANEL  WITH GFR -     CBC with Differential/Platelet  Elevated LDL cholesterol level -     Lipid Panel w/reflex Direct LDL  Screening for diabetes mellitus -     COMPLETE METABOLIC PANEL WITH GFR  Family history of bladder cancer -     POCT urinalysis dipstick  Atypical nevus -     Dermatology pathology -     Dermatology pathology  .Marland Kitchen Depression screen [facility] 2/9 10/26/2017 12/17/2016  Decreased Interest 0 0  Down, Depressed, Hopeless 0 0  PHQ - 2 Score 0 0    .Marland Kitchen Results for orders placed or performed in visit on 10/26/17  POCT urinalysis dipstick  Result Value Ref Range   Color, UA light yellow    Clarity, UA clear    Glucose, UA neg    Bilirubin, UA neg    Ketones, UA neg    Spec Grav, UA 1.010 1.010 - 1.025   Blood, UA neg    pH, UA 7.0 5.0 - 8.0   Protein, UA neg    Urobilinogen, UA 0.2 0.2 or 1.0 E.U./dL   Nitrite, UA neg    Leukocytes, UA Trace (A) Negative   Appearance     Odor     .Marland KitchenStart a regular exercise program and make sure you are eating a healthy diet Try to eat 4 servings of dairy a day or take a calcium supplement (500mg  twice a day). Your vaccines are up to date.  Preventative labs ordered.   UA negative for blood. Ordered due to patients family history of bladder cancer.   Strongly encouraged smoking cessation completely he continues to smoke at least once a week. Pt agrees.   2 atypical nevus removed. Will adjust treatment plan once biopsy results are in.   Shave Biopsy Procedure Note  Pre-operative Diagnosis: Suspicious lesion  Post-operative Diagnosis: same  Locations:upper left back and mid lower back  Indications: suspicious.   Anesthesia: Lidocaine 1% with epinephrine without added sodium bicarbonate  Procedure Details  History of allergy to iodine: no  Patient informed of the risks (including bleeding and infection) and benefits of the  procedure and Verbal informed consent obtained.  The lesion and surrounding area were given a  sterile prep using chlorhexidine and draped in the usual sterile fashion. A scalpel was used to shave an area of skin approximately 6cm by 6cm and 57mm by 30mm.  Hemostasis achieved  with alumuninum chloride. Antibiotic ointment and a sterile dressing applied.  The specimen was sent for pathologic examination. The patient tolerated the procedure well.  EBL: scant ml  Condition: Stable  Complications: none.  Plan: 1. Instructed to keep the wound dry and covered for 24-48h and clean thereafter. 2. Warning signs of infection were reviewed.   3. Recommended that patient use tylenol for any pain after procedure.

## 2017-10-26 NOTE — Progress Notes (Signed)
Subjective:    I'm seeing this patient as a consultation for:   Iran Planas, PA-C  CC: Right foot mass  HPI: This is a pleasant 47 year old male, intermittently he will have a mass protruding from the medial aspect of his right first MTP, nontender but rock hard.  He took a picture of it, it has since resolved.  Symptoms are moderate, intermittent.  No trauma, no constitutional symptoms, no real discomfort at the MTP itself.  He does have some orthotics and would like a new pair as well.  I reviewed the past medical history, family history, social history, surgical history, and allergies today and no changes were needed.  Please see the problem list section below in epic for further details.  Past Medical History: Past Medical History:  Diagnosis Date  . Lip lesion 03/01/2013   Past Surgical History: Past Surgical History:  Procedure Laterality Date  . HYDROCELE EXCISION / REPAIR     Social History: Social History   Socioeconomic History  . Marital status: Single    Spouse name: None  . Number of children: None  . Years of education: None  . Highest education level: None  Social Needs  . Financial resource strain: None  . Food insecurity - worry: None  . Food insecurity - inability: None  . Transportation needs - medical: None  . Transportation needs - non-medical: None  Occupational History  . None  Tobacco Use  . Smoking status: Former Smoker    Types: Cigarettes  . Smokeless tobacco: Never Used  Substance and Sexual Activity  . Alcohol use: Yes    Alcohol/week: 2.5 oz    Types: 5 Standard drinks or equivalent per week  . Drug use: No  . Sexual activity: Yes    Partners: Female    Comment: male  Other Topics Concern  . None  Social History Narrative  . None   Family History: Family History  Problem Relation Age of Onset  . Diabetes Mother   . Cancer Father        kidney  . Heart attack Father    Allergies: No Known Allergies Medications: See  med rec.  Review of Systems: No headache, visual changes, nausea, vomiting, diarrhea, constipation, dizziness, abdominal pain, skin rash, fevers, chills, night sweats, weight loss, swollen lymph nodes, body aches, joint swelling, muscle aches, chest pain, shortness of breath, mood changes, visual or auditory hallucinations.   Objective:   General: Well Developed, well nourished, and in no acute distress.  Neuro:  Extra-ocular muscles intact, able to move all 4 extremities, sensation grossly intact.  Deep tendon reflexes tested were normal. Psych: Alert and oriented, mood congruent with affect. ENT:  Ears and nose appear unremarkable.  Hearing grossly normal. Neck: Unremarkable overall appearance, trachea midline.  No visible thyroid enlargement. Eyes: Conjunctivae and lids appear unremarkable.  Pupils equal and round. Skin: Warm and dry, no rashes noted.  Cardiovascular: Pulses palpable, no extremity edema. Right foot: No visible erythema or swelling. Range of motion is full in all directions. Strength is 5/5 in all directions. No hallux valgus. No pes cavus or pes planus. No abnormal callus noted. No pain over the navicular prominence, or base of fifth metatarsal. No tenderness to palpation of the calcaneal insertion of plantar fascia. No pain at the Achilles insertion. No pain over the calcaneal bursa. No pain of the retrocalcaneal bursa. No tenderness to palpation over the tarsals, metatarsals, or phalanges. No hallux rigidus or limitus. No tenderness palpation over  interphalangeal joints. No pain with compression of the metatarsal heads. Neurovascularly intact distally.  I did review his picture, it looks like a ganglion cyst.  Impression and Recommendations:   This case required medical decision making of moderate complexity.  Ganglion cyst of right foot That has since resolved, from the medial first MTP. X-rays, I do suspect some arthritis. He will return for new set of  custom orthotics. No further intervention needed. ___________________________________________ Gwen Her. Dianah Field, M.D., ABFM., CAQSM. Primary Care and [place] Instructor of [place] of [facility]

## 2017-10-27 ENCOUNTER — Encounter: Payer: Self-pay | Admitting: Physician Assistant

## 2017-10-27 DIAGNOSIS — D229 Melanocytic nevi, unspecified: Secondary | ICD-10-CM | POA: Insufficient documentation

## 2017-10-27 NOTE — Progress Notes (Signed)
Call pt: mildly atypical but nothing to be concerned with. We can continue to remove lesions that grow or change over time.

## 2018-02-10 ENCOUNTER — Ambulatory Visit (INDEPENDENT_AMBULATORY_CARE_PROVIDER_SITE_OTHER): Payer: BLUE CROSS/BLUE SHIELD | Admitting: Sports Medicine

## 2018-02-10 ENCOUNTER — Encounter: Payer: Self-pay | Admitting: Sports Medicine

## 2018-02-10 ENCOUNTER — Ambulatory Visit: Payer: BLUE CROSS/BLUE SHIELD

## 2018-02-10 DIAGNOSIS — M7542 Impingement syndrome of left shoulder: Secondary | ICD-10-CM

## 2018-02-10 NOTE — Assessment & Plan Note (Signed)
Patient prefers to not use an NSAID, he is going to use CBD oil. Adding an x-ray and formal physical therapy. Return in 6 weeks.

## 2018-02-10 NOTE — Progress Notes (Signed)
Subjective:    I'm seeing this patient as a consultation for:   Iran Planas, PA-C  CC: Left shoulder pain  HPI: This is a pleasant 47 year old male, for the past several months he said increasing pain in his left shoulder, over the deltoid, worse with overhead activities, reaching, does not wake him from sleep, no mechanical symptoms, no recent trauma.  No constitutional symptoms.  No paresthesias into the hand or fingertips, no neck pain.  I reviewed the past medical history, family history, social history, surgical history, and allergies today and no changes were needed.  Please see the problem list section below in epic for further details.  Past Medical History: Past Medical History:  Diagnosis Date  . Lip lesion 03/01/2013   Past Surgical History: Past Surgical History:  Procedure Laterality Date  . HYDROCELE EXCISION / REPAIR     Social History: Social History   Socioeconomic History  . Marital status: Single    Spouse name: Not on file  . Number of children: Not on file  . Years of education: Not on file  . Highest education level: Not on file  Occupational History  . Not on file  Social Needs  . Financial resource strain: Not on file  . Food insecurity:    Worry: Not on file    Inability: Not on file  . Transportation needs:    Medical: Not on file    Non-medical: Not on file  Tobacco Use  . Smoking status: Former Smoker    Types: Cigarettes  . Smokeless tobacco: Never Used  Substance and Sexual Activity  . Alcohol use: Yes    Alcohol/week: 2.5 oz    Types: 5 Standard drinks or equivalent per week  . Drug use: No  . Sexual activity: Yes    Partners: Female    Comment: male  Lifestyle  . Physical activity:    Days per week: Not on file    Minutes per session: Not on file  . Stress: Not on file  Relationships  . Social connections:    Talks on phone: Not on file    Gets together: Not on file    Attends religious service: Not on file    Active  member of club or organization: Not on file    Attends meetings of clubs or organizations: Not on file    Relationship status: Not on file  Other Topics Concern  . Not on file  Social History Narrative  . Not on file   Family History: Family History  Problem Relation Age of Onset  . Diabetes Mother   . Cancer Father        kidney  . Heart attack Father    Allergies: No Known Allergies Medications: See med rec.  Review of Systems: No headache, visual changes, nausea, vomiting, diarrhea, constipation, dizziness, abdominal pain, skin rash, fevers, chills, night sweats, weight loss, swollen lymph nodes, body aches, joint swelling, muscle aches, chest pain, shortness of breath, mood changes, visual or auditory hallucinations.   Objective:   General: Well Developed, well nourished, and in no acute distress.  Neuro:  Extra-ocular muscles intact, able to move all 4 extremities, sensation grossly intact.  Deep tendon reflexes tested were normal. Psych: Alert and oriented, mood congruent with affect. ENT:  Ears and nose appear unremarkable.  Hearing grossly normal. Neck: Unremarkable overall appearance, trachea midline.  No visible thyroid enlargement. Eyes: Conjunctivae and lids appear unremarkable.  Pupils equal and round. Skin: Warm and dry,  no rashes noted.  Cardiovascular: Pulses palpable, no extremity edema. Left shoulder: Inspection reveals no abnormalities, atrophy or asymmetry. Palpation is normal with no tenderness over AC joint or bicipital groove. ROM is full in all planes. Rotator cuff strength normal throughout. Positive Neer and Hawkin's tests, empty can. Speeds and Yergason's tests normal. No labral pathology noted with negative Obrien's, negative crank, negative clunk, and good stability. Normal scapular function observed. No painful arc and no drop arm sign. No apprehension sign  Impression and Recommendations:   This case required medical decision making of  moderate complexity.  Impingement syndrome, shoulder, left Patient prefers to not use an NSAID, he is going to use CBD oil. Adding an x-ray and formal physical therapy. Return in 6 weeks. ___________________________________________ Gwen Her. Dianah Field, M.D., ABFM., CAQSM. Primary Care and [place] Instructor of [place] of [facility]

## 2018-02-17 ENCOUNTER — Encounter: Payer: Self-pay | Admitting: Rehabilitative and Restorative Service Providers"

## 2018-02-17 ENCOUNTER — Other Ambulatory Visit: Payer: Self-pay

## 2018-02-17 ENCOUNTER — Ambulatory Visit (INDEPENDENT_AMBULATORY_CARE_PROVIDER_SITE_OTHER): Payer: BLUE CROSS/BLUE SHIELD | Admitting: Rehabilitative and Restorative Service Providers"

## 2018-02-17 DIAGNOSIS — M25512 Pain in left shoulder: Secondary | ICD-10-CM

## 2018-02-17 MED ORDER — MELOXICAM 15 MG PO TABS: ORAL_TABLET | ORAL | 3 refills | Status: AC

## 2018-02-17 NOTE — Therapy (Addendum)
[place], [place], [address] Phone: [phone]   Fax:  [phone]  Physical Therapy Evaluation  Patient Details  Name: Damon Maddox MRN: [id number] Date of Birth: September 15, 1970 No data recorded  Encounter Date: 02/17/2018  Patient  Refused evaluation due to cost of treatment. Discussed importance and benefits of therapy. Offered suggestions for postural correction and exercises. No charge visit.   Past Medical History:  Diagnosis Date  . Lip lesion 03/01/2013    Past Surgical History:  Procedure Laterality Date  . HYDROCELE EXCISION / REPAIR      There were no vitals filed for this visit.                  Objective measurements completed on examination: See above findings.                             Patient will benefit from skilled therapeutic intervention in order to improve the following deficits and impairments:     Visit Diagnosis: Acute pain of left shoulder     Problem List Patient Active Problem List   Diagnosis Date Noted  . Impingement syndrome, shoulder, left 02/10/2018  . 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 Ganglion cyst of right foot 10/26/2017  . Elevated LDL cholesterol level 02/20/2017  . Family history of kidney cancer 12/17/2016  . Low libido 08/09/2014  . Bilateral foot pain 07/08/2013  . Internal hordeolum of left eye 05/17/2013  . Lip lesion 03/01/2013  . Right lumbar radiculitis 03/01/2013  . Tobacco use disorder 03/01/2013    Savi Lastinger Nilda Simmer PT, MPH  02/17/2018, 3:16 PM  [facility] [place], [place], [address] Phone: [phone]   Fax:  [phone]  Name: Stelios Kirby MRN: [id number] Date of Birth: [birth year]

## 2018-02-17 NOTE — Telephone Encounter (Signed)
He had said he preferred to use CBD oil, guess its not working, calling in meloxicam.

## 2018-02-17 NOTE — Telephone Encounter (Signed)
Called and LM on VM that med had been sent to pharmacy. KG LPN

## 2018-02-17 NOTE — Telephone Encounter (Signed)
PT came in office to ask for the anti-inflammatory medication. Pharmacy is on file.

## 2018-02-17 NOTE — Patient Instructions (Signed)
Axial Extension (Chin Tuck)    Pull chin in and lengthen back of neck. Hold __5__ seconds while counting out loud. Repeat __10__ times. Do __several__ sessions per day.  Shoulder Blade Squeeze   With swim noodle  Rotate shoulders back, then squeeze shoulder blades down and back  Hold 10 sec Repeat _10___ times. Do _several ___ sessions per day.  Upper Back Strength: Lower Trapezius / Rotator Cuff " L's "     Arms in waitress pose, palms up. Press hands back and slide shoulder blades down. Hold for __5__ seconds. Repeat _10___ times. 1-2 times per day.    Scapular Retraction: Elbow Flexion (Standing)  "W's"     With elbows bent to 90, pinch shoulder blades together and rotate arms out, keeping elbows bent. Repeat __10__ times per set. Do __1-2__ sets per session. Do _several ___ sessions per day.   Scapula Adduction With Pectoralis Stretch: Low - Standing   Shoulders at 45 hands even with shoulders, keeping weight through legs, shift weight forward until you feel pull or stretch through the front of your chest. Hold _30__ seconds. Do _3__ times, _2-4__ times per day.   Scapula Adduction With Pectoralis Stretch: Mid-Range - Standing   Shoulders at 90 elbows even with shoulders, keeping weight through legs, shift weight forward until you feel pull or strength through the front of your chest. Hold __30_ seconds. Do _3__ times, __2-4_ times per day.   Scapula Adduction With Pectoralis Stretch: High - Standing   Shoulders at 120 hands up high on the doorway, keeping weight on feet, shift weight forward until you feel pull or stretch through the front of your chest. Hold _30__ seconds. Do _3__ times, _2-3__ times per day.    Self massage using ~ 4 inch plastic ball   Knee Roll    Lying on back, with knees bent and feet flat on floor, arms outstretched to sides, slowly roll both knees to side, hold 5 seconds. Back to starting position, hold 5 seconds. Then to  opposite side, hold 20-30 seconds. Return to starting position. Keep shoulders and arms in contact with floor.    Lying on noodle along spine 2-5 min arms at side     TENS UNIT: This is helpful for muscle pain and spasm.   Search and Purchase a TENS 7000 2nd edition at www.tenspros.com. It should be less than $30.     TENS unit instructions: Do not shower or bathe with the unit on Turn the unit off before removing electrodes or batteries If the electrodes lose stickiness add a drop of water to the electrodes after they are disconnected from the unit and place on plastic sheet. If you continued to have difficulty, call the TENS unit company to purchase more electrodes. Do not apply lotion on the skin area prior to use. Make sure the skin is clean and dry as this will help prolong the life of the electrodes. After use, always check skin for unusual red areas, rash or other skin difficulties. If there are any skin problems, does not apply electrodes to the same area. Never remove the electrodes from the unit by pulling the wires. Do not use the TENS unit or electrodes other than as directed. Do not change electrode placement without consultating your therapist or physician. Keep 2 fingers with between each electrode.    [facility] at [facility] [place], [place] [address]  [phone] (office) [phone] (fax)

## 2018-03-19 ENCOUNTER — Emergency Department (INDEPENDENT_AMBULATORY_CARE_PROVIDER_SITE_OTHER)
Admission: EM | Admit: 2018-03-19 | Discharge: 2018-03-19 | Disposition: A | Discharge status: Home / Self Care | Payer: BLUE CROSS/BLUE SHIELD | Source: Home / Self Care | Attending: Family Medicine | Admitting: Family Medicine

## 2018-03-19 ENCOUNTER — Encounter: Payer: Self-pay | Admitting: Emergency Medicine

## 2018-03-19 ENCOUNTER — Emergency Department (INDEPENDENT_AMBULATORY_CARE_PROVIDER_SITE_OTHER): Payer: BLUE CROSS/BLUE SHIELD

## 2018-03-19 ENCOUNTER — Other Ambulatory Visit: Payer: Self-pay

## 2018-03-19 DIAGNOSIS — R519 Headache, unspecified: Secondary | ICD-10-CM

## 2018-03-19 DIAGNOSIS — R479 Unspecified speech disturbances: Secondary | ICD-10-CM | POA: Diagnosis not present

## 2018-03-19 DIAGNOSIS — R4789 Other speech disturbances: Secondary | ICD-10-CM | POA: Diagnosis not present

## 2018-03-19 DIAGNOSIS — R4701 Aphasia: Secondary | ICD-10-CM | POA: Diagnosis not present

## 2018-03-19 DIAGNOSIS — R51 Headache: Secondary | ICD-10-CM

## 2018-03-19 NOTE — ED Provider Notes (Signed)
[facility]    CSN: [id number] Arrival date & time: 03/19/18  1355     History   Chief Complaint Chief Complaint  Patient presents with  . Headache    HPI Damon Maddox is a 47 y.o. male.   Patient reports that while at work about three hours ago he briefly developed a right "blind spot"  in which he lost his right visual field.  About one hour ago he developed a typical frontal headache.  He became concerned when he realized that he was having difficulty remembering words.  He normally has a frontal headache about once per month, but without other neurologic symptoms.  He states that he is now remembering works without difficulty. He admits that he has been under increased stress at work recently.  The history is provided by the patient and the spouse.  Headache  Pain location:  Frontal Quality:  Dull Radiates to:  Does not radiate Onset quality:  Sudden Duration:  1 hour Timing:  Constant Progression:  Improving Chronicity:  Recurrent Similar to prior headaches: no   Context: emotional stress   Relieved by:  None tried Worsened by:  Nothing Ineffective treatments:  None tried Associated symptoms: visual change   Associated symptoms: no blurred vision, no congestion, no dizziness, no ear pain, no eye pain, no facial pain, no fatigue, no fever, no focal weakness, no hearing loss, no loss of balance, no nausea, no near-syncope, no neck pain, no neck stiffness, no numbness, no paresthesias, no photophobia, no seizures, no sinus pressure, no sore throat, no syncope and no weakness   Associated symptoms comment:  Anomic aphasia   Past Medical History:  Diagnosis Date  . Lip lesion 03/01/2013    Patient Active Problem List   Diagnosis Date Noted  . Impingement syndrome, shoulder, left 02/10/2018  . 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 Ganglion cyst of right foot 10/26/2017  . Elevated LDL cholesterol level 02/20/2017  . Family history of kidney cancer 12/17/2016  .  Low libido 08/09/2014  . Bilateral foot pain 07/08/2013  . Internal hordeolum of left eye 05/17/2013  . Lip lesion 03/01/2013  . Right lumbar radiculitis 03/01/2013  . Tobacco use disorder 03/01/2013    Past Surgical History:  Procedure Laterality Date  . HYDROCELE EXCISION / REPAIR         Home Medications    Prior to Admission medications   Medication Sig Start Date End Date Taking? Authorizing Provider  meloxicam (MOBIC) 15 MG tablet One tab PO qAM with breakfast for 2 weeks, then daily prn pain. 02/17/18   Silverio Decamp, MD    Family History Family History  Problem Relation Age of Onset  . Diabetes Mother   . Cancer Father        kidney  . Heart attack Father     Social History Social History   Tobacco Use  . Smoking status: Former Smoker    Types: Cigarettes  . Smokeless tobacco: Never Used  Substance Use Topics  . Alcohol use: Yes    Alcohol/week: 3.0 oz    Types: 5 Standard drinks or equivalent per week  . Drug use: No     Allergies   Patient has no known allergies.   Review of Systems Review of Systems  Constitutional: Negative for fatigue and fever.  HENT: Negative for congestion, ear pain, hearing loss, sinus pressure and sore throat.   Eyes: Negative for blurred vision, photophobia and pain.  Cardiovascular: Negative for syncope and  near-syncope.  Gastrointestinal: Negative for nausea.  Musculoskeletal: Negative for neck pain and neck stiffness.  Neurological: Positive for headaches. Negative for dizziness, tremors, focal weakness, seizures, syncope, facial asymmetry, speech difficulty, weakness, light-headedness, numbness, paresthesias and loss of balance.  All other systems reviewed and are negative.    Physical Exam Triage Vital Signs ED Triage Vitals  Enc Vitals Group     BP 03/19/18 1404 116/82     Pulse Rate 03/19/18 1404 64     Resp --      Temp 03/19/18 1404 98.5 F (36.9 C)     Temp Source 03/19/18 1404 Oral      SpO2 03/19/18 1404 99 %     Weight 03/19/18 1406 116 lb (52.6 kg)     Height 03/19/18 1406 5\' 6"  (1.676 m)     Head Circumference --      Peak Flow --      Pain Score 03/19/18 1405 4     Pain Loc --      Pain Edu? --      Excl. in [place]? --    No data found.  Updated Vital Signs BP 116/82 (BP Location: Right Arm)   Pulse 64   Temp 98.5 F (36.9 C) (Oral)   Ht 5\' 6"  (1.676 m)   Wt 116 lb (52.6 kg)   SpO2 99%   BMI 18.72 kg/m   Visual Acuity Right Eye Distance:   Left Eye Distance:   Bilateral Distance:    Right Eye Near:   Left Eye Near:    Bilateral Near:     Physical Exam Nursing notes and Vital Signs reviewed. Appearance:  Patient appears stated age, and in no acute distress Eyes:  Pupils are equal, round, and reactive to light and accomodation.  Extraocular movement is intact.  Conjunctivae are not inflamed.  Fundi benign. Ears:  Canals normal.  Tympanic membranes normal.  Nose:   Normal turbinates.  No sinus tenderness.   Pharynx:  Normal Neck:  Supple.  No adenopathy or thyromegaly.  Carotids have normal upstrokes without bruits. Lungs:  Clear to auscultation.  Breath sounds are equal.  Moving air well. Heart:  Regular rate and rhythm without murmurs, rubs, or gallops.  Abdomen:  Nontender without masses or hepatosplenomegaly.  Bowel sounds are present.  No CVA or flank tenderness.  Extremities:  No edema.  Skin:  No rash present.   Neurologic:  Cranial nerves 2 through 12 are normal.  Patellar, achilles, and elbow reflexes are normal.  Cerebellar function is intact (finger-to-nose and rapid alternating hand movement).  Gait and station are normal.  Grip strength symmetric bilaterally.  Romberg negative.   UC Treatments / Results  Labs (all labs ordered are listed, but only abnormal results are displayed) Labs Reviewed - No data to display  EKG None  Radiology Ct Head Wo Contrast  Result Date: 03/19/2018 CLINICAL DATA:  47 y/o  M; speech difficulty. EXAM:  CT HEAD WITHOUT CONTRAST TECHNIQUE: Contiguous axial images were obtained from the base of the skull through the vertex without intravenous contrast. COMPARISON:  None. FINDINGS: Brain: No evidence of acute infarction, hemorrhage, hydrocephalus, extra-axial collection or mass lesion/mass effect. Vascular: No hyperdense vessel or unexpected calcification. Skull: Normal. Negative for fracture or focal lesion. Sinuses/Orbits: No acute finding. Other: None. IMPRESSION: Negative CT of the head. Electronically Signed   By: Kristine Garbe M.D.   On: 03/19/2018 15:23    Procedures Procedures (including critical care time)  Medications Ordered in  UC Medications - No data to display  Initial Impression / Assessment and Plan / UC Course  I have reviewed the triage vital signs and the nursing notes.  Pertinent labs & imaging results that were available during my care of the patient were reviewed by me and considered in my medical decision making (see chart for details).    Normal neurologic exam and CT head reassuring. Suspect underlying anxiety. Followup with neurologist if symptoms worsen or persist. Consider follow-up with PCP for treatment of anxiety.   Final Clinical Impressions(s) / UC Diagnoses   Final diagnoses:  Anomic aphasia  Nonintractable episodic headache, unspecified headache type   Discharge Instructions   None    ED Prescriptions    None         Kandra Nicolas, MD 03/26/18 1055

## 2018-03-19 NOTE — ED Triage Notes (Signed)
Frontal Headache today with aura, having trouble for a few minutes thinking of employees names. Has not had this type of headache before 4/10

## 2018-03-24 ENCOUNTER — Telehealth: Payer: Self-pay | Admitting: Sports Medicine

## 2018-03-24 ENCOUNTER — Ambulatory Visit: Payer: BLUE CROSS/BLUE SHIELD | Admitting: Sports Medicine

## 2018-03-24 NOTE — Telephone Encounter (Signed)
Missed appt 03/24/18 9:30

## 2018-11-22 ENCOUNTER — Ambulatory Visit: Payer: BLUE CROSS/BLUE SHIELD | Admitting: Sports Medicine

## 2019-10-20 IMAGING — CT CT HEAD W/O CM
3 series · 15 of 33 positions shown, 18 images · non-contrast
Comparison: None.

CLINICAL DATA: 47 y/o  M; speech difficulty.

EXAM:
CT HEAD WITHOUT CONTRAST
TECHNIQUE: Contiguous axial images were obtained from the base of the skull
through the vertex without intravenous contrast.

[Series 2: head wo · axial · 0.43mm/px · z∈[-175,-50]mm · 7 of 31 slices shown, 9 images]
[im 3/31  soft-tissue]
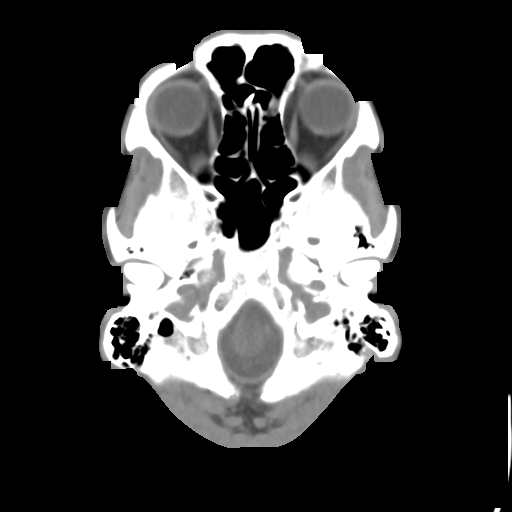
[im 3/31  bone]
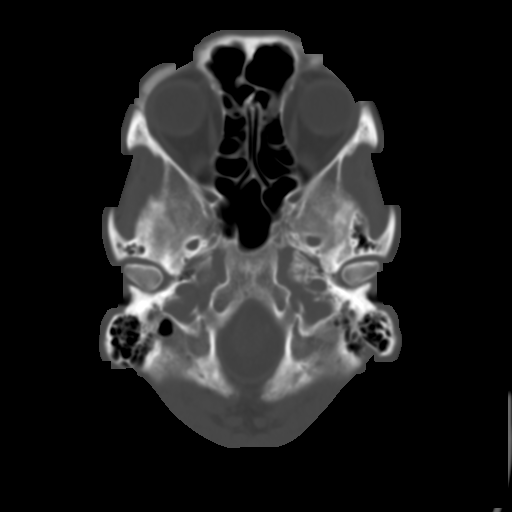
[im 7/31  bone]
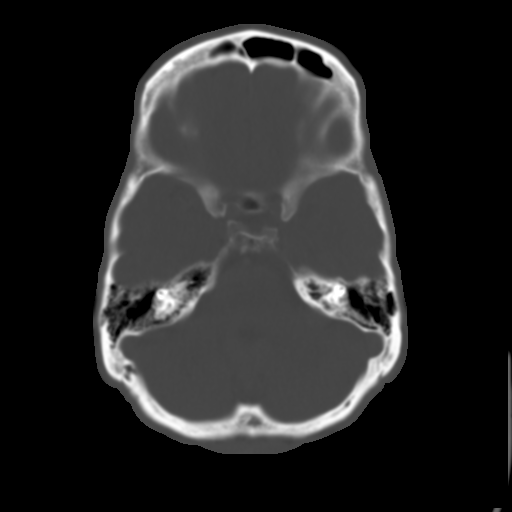
[im 12/31  bone]
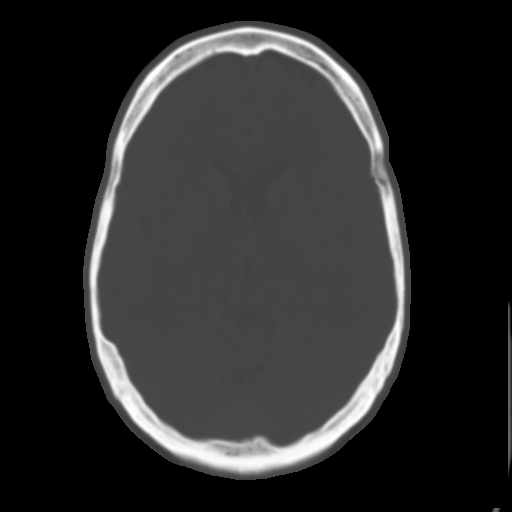
[im 17/31  bone]
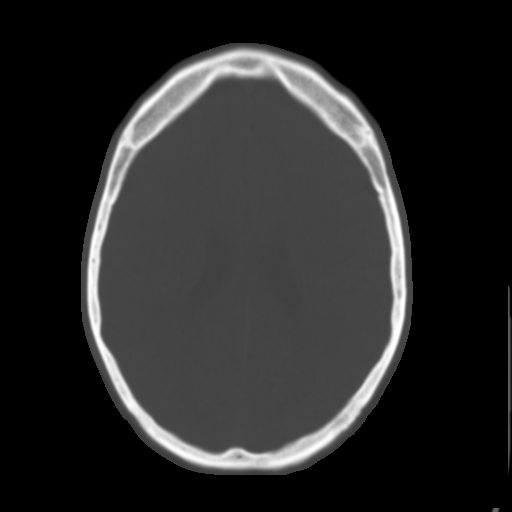
[im 19/31  soft-tissue]
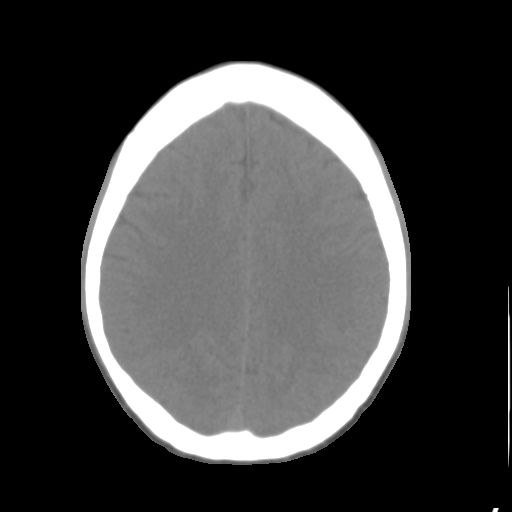
[im 19/31  bone]
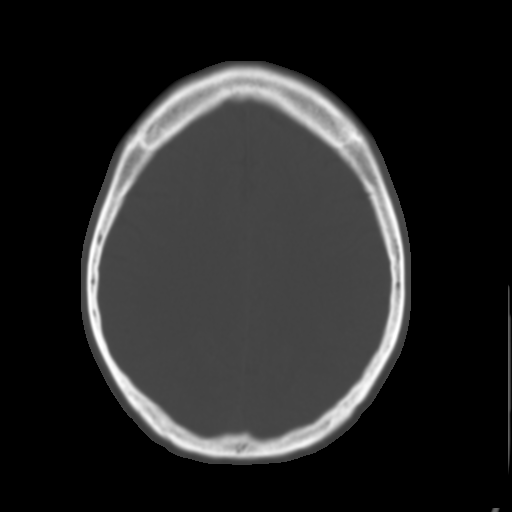
[im 24/31  bone]
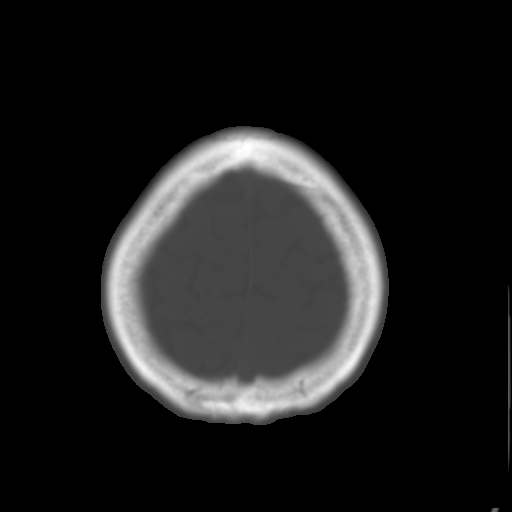
[im 28/31  bone]
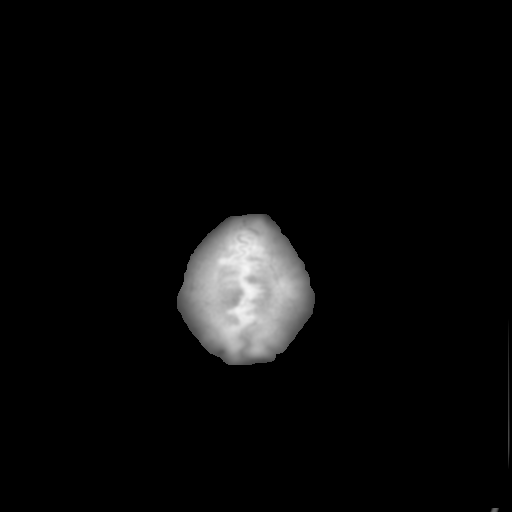

[Series 4: head wo coronal · coronal · 0.31mm/px · 3 of 70 slices shown]
[im 14/70  bone]
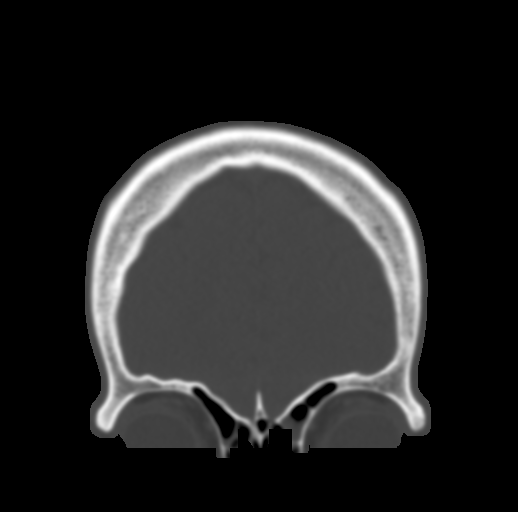
[im 28/70  bone]
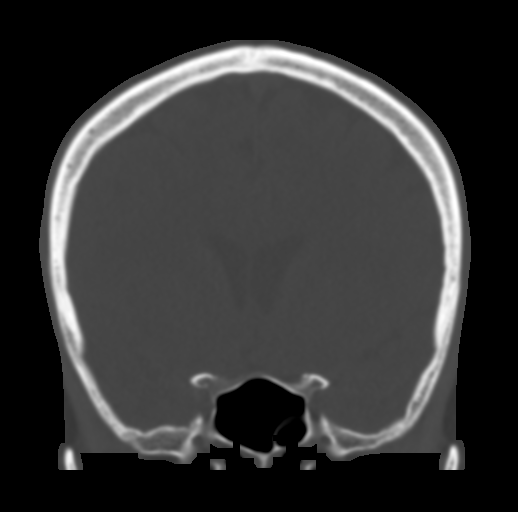
[im 42/70  bone]
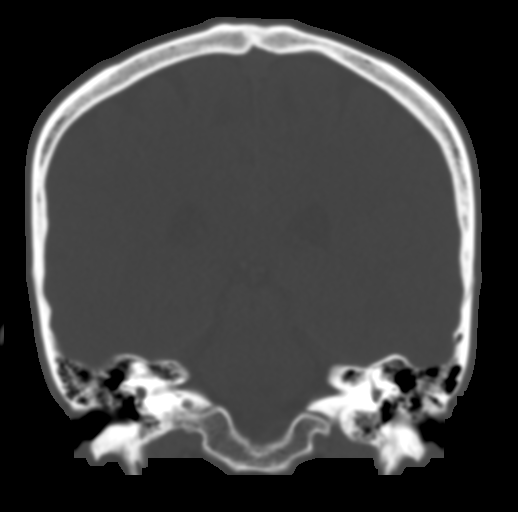

[Series 5: head wo sagittal · sagittal · 0.30mm/px · 5 of 52 slices shown, 6 images]
[im 18/52  bone]
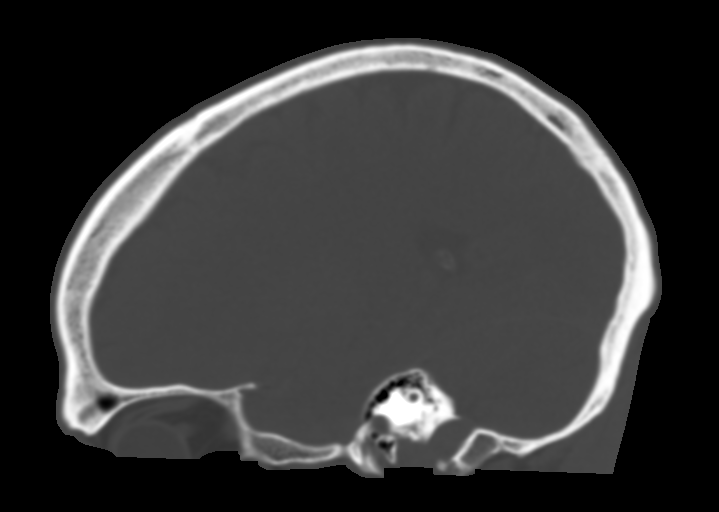
[im 22/52  bone]
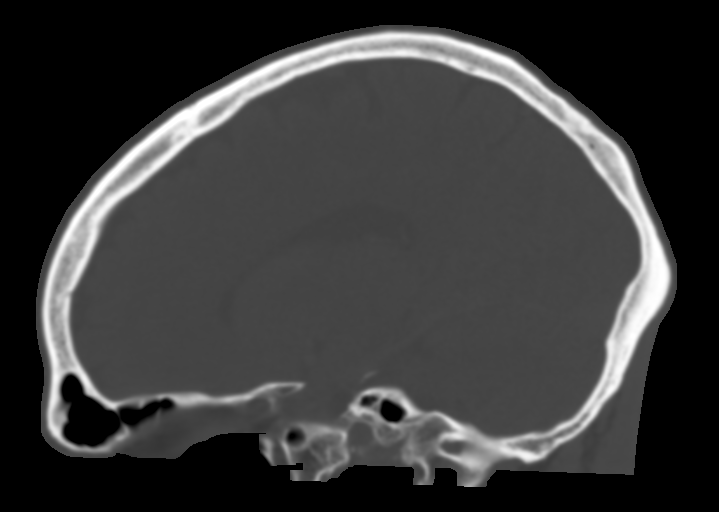
[im 26/52  soft-tissue]
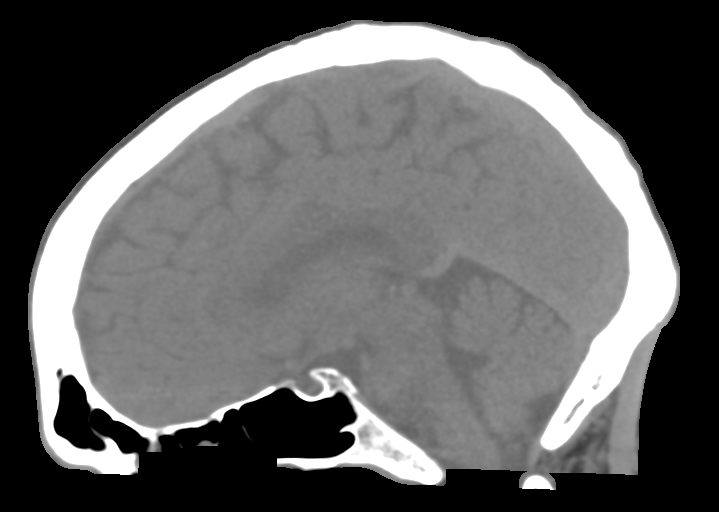
[im 26/52  bone]
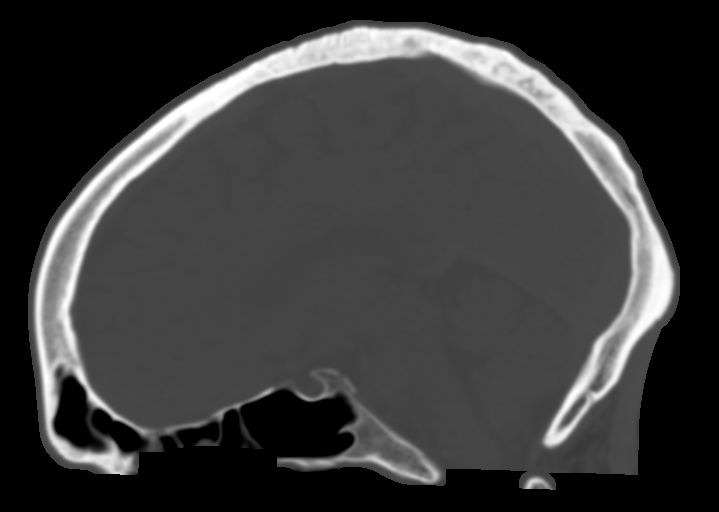
[im 30/52  bone]
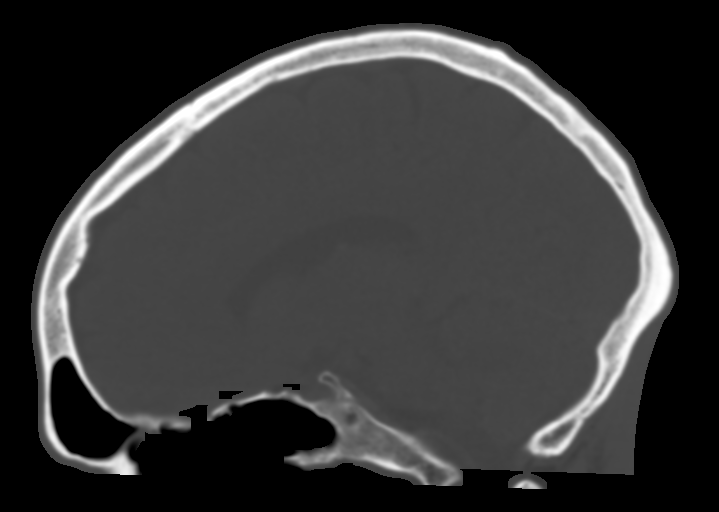
[im 35/52  bone]
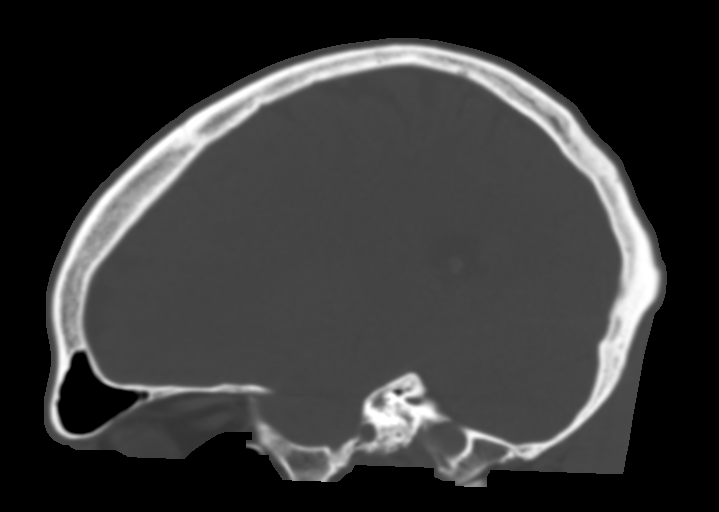

[15 of 33 positions shown; findings below may reference images not displayed]

FINDINGS: Brain: No evidence of acute infarction, hemorrhage, hydrocephalus,
extra-axial collection or mass lesion/mass effect.

Vascular: No hyperdense vessel or unexpected calcification.

Skull: Normal. Negative for fracture or focal lesion.

Sinuses/Orbits: No acute finding.

Other: None.
IMPRESSION: Negative CT of the head.

By: Relindas Auad M.D.
